# Patient Record
Sex: Male | Born: 1990
Health system: Southern US, Community
[De-identification: ages and names within clinical notes are randomized; demographics above are authoritative.]

## PROBLEM LIST (undated history)

## (undated) DIAGNOSIS — L409 Psoriasis, unspecified: Secondary | ICD-10-CM

---

## 2016-11-25 ENCOUNTER — Emergency Department (HOSPITAL_COMMUNITY)
Admission: EM | Admit: 2016-11-25 | Discharge: 2016-11-25 | Disposition: A | Discharge status: Home / Self Care | Payer: Self-pay | Attending: Emergency Medicine | Admitting: Emergency Medicine

## 2016-11-25 ENCOUNTER — Encounter (HOSPITAL_COMMUNITY): Payer: Self-pay

## 2016-11-25 ENCOUNTER — Emergency Department (HOSPITAL_COMMUNITY): Payer: Self-pay

## 2016-11-25 DIAGNOSIS — R51 Headache: Secondary | ICD-10-CM | POA: Insufficient documentation

## 2016-11-25 DIAGNOSIS — R112 Nausea with vomiting, unspecified: Secondary | ICD-10-CM | POA: Insufficient documentation

## 2016-11-25 DIAGNOSIS — R519 Headache, unspecified: Secondary | ICD-10-CM

## 2016-11-25 HISTORY — DX: Psoriasis, unspecified: L40.9

## 2016-11-25 LAB — COMPREHENSIVE METABOLIC PANEL
ALBUMIN: 4 g/dL (ref 3.5–5.0)
ALK PHOS: 42 U/L (ref 38–126)
ALT: 28 U/L (ref 17–63)
AST: 31 U/L (ref 15–41)
Anion gap: 4 — ABNORMAL LOW (ref 5–15)
BILIRUBIN TOTAL: 1.2 mg/dL (ref 0.3–1.2)
BUN: 15 mg/dL (ref 6–20)
CALCIUM: 9.3 mg/dL (ref 8.9–10.3)
CO2: 31 mmol/L (ref 22–32)
CREATININE: 1.17 mg/dL (ref 0.61–1.24)
Chloride: 106 mmol/L (ref 101–111)
GFR calc non Af Amer: >60 mL/min (ref >60)
GLUCOSE: 78 mg/dL (ref 65–99)
Potassium: 4.3 mmol/L (ref 3.5–5.1)
SODIUM: 141 mmol/L (ref 135–145)
TOTAL PROTEIN: 6.5 g/dL (ref 6.5–8.1)

## 2016-11-25 LAB — LIPASE, BLOOD: Lipase: 28 U/L (ref 11–51)

## 2016-11-25 LAB — CBC WITH DIFFERENTIAL/PLATELET
BASOS PCT: 0 %
Basophils Absolute: 0 10*3/uL (ref 0.0–0.1)
EOS ABS: 0.2 10*3/uL (ref 0.0–0.7)
EOS PCT: 2 %
HCT: 40.1 % (ref 39.0–52.0)
Hemoglobin: 14.1 g/dL (ref 13.0–17.0)
LYMPHS ABS: 3.4 10*3/uL (ref 0.7–4.0)
Lymphocytes Relative: 28 %
MCH: 32.4 pg (ref 26.0–34.0)
MCHC: 35.2 g/dL (ref 30.0–36.0)
MCV: 92.2 fL (ref 78.0–100.0)
MONO ABS: 0.7 10*3/uL (ref 0.1–1.0)
MONOS PCT: 6 %
Neutro Abs: 7.6 10*3/uL (ref 1.7–7.7)
Neutrophils Relative %: 64 %
PLATELETS: 238 10*3/uL (ref 150–400)
RBC: 4.35 MIL/uL (ref 4.22–5.81)
RDW: 11.8 % (ref 11.5–15.5)
WBC: 11.9 10*3/uL — ABNORMAL HIGH (ref 4.0–10.5)

## 2016-11-25 MED ORDER — ONDANSETRON 4 MG PO TBDP: 4.0000 mg | ORAL_TABLET | Freq: Three times a day (TID) | ORAL | 0 refills | Status: DC | PRN

## 2016-11-25 MED ORDER — PANTOPRAZOLE SODIUM 40 MG PO TBEC: 40.0000 mg | DELAYED_RELEASE_TABLET | Freq: Every day | ORAL | 0 refills | Status: DC

## 2016-11-25 MED ORDER — ONDANSETRON 4 MG PO TBDP
4.0000 mg | ORAL_TABLET | Freq: Once | ORAL | Status: AC | PRN
  Administered 2016-11-25: 4 mg via ORAL
  Filled 2016-11-25: qty 1

## 2016-11-25 NOTE — ED Provider Notes (Addendum)
[facility]-EMERGENCY DEPT Provider Note   CSN: [id number] Arrival date & time: 11/25/16  0801     History   Chief Complaint Chief Complaint  Patient presents with  . Headache  . Abdominal Pain    HPI Jeremy Mcneil is a 26 y.o. male.  HPI. 26 year old male presents with a chief complaint of headache since awakening around 6 AM. Pain was diffuse. He raises it as a 7 out of 10. Took Aleve and has felt better now has no headache. Headache last about 2 hours. He does not think the headache will come up a suitable he gets over this time. He has had on and off headaches at least once or twice a week for the past 1-1/2 months. These are getting more frequent. Today was worse than typical. Headaches also seem to change locations. There is never associated nausea, vomiting, blurry vision, or photophobia. He did not have fever today. No focal weakness or numbness. Patient also has been having intermittent nausea and occasional vomiting after eating. He states it starts about 5 minutes after eating almost anything. Typically does not vomit. Denies of there is actually abdominal pain. This does not seem to coincide with the headache but has been lasting just about as long. Currently feels asymptomatic. Was not the worst headache of his life  Past Medical History:  Diagnosis Date  . Psoriasis     There are no active problems to display for this patient.   History reviewed. No pertinent surgical history.     Home Medications    Prior to Admission medications   Medication Sig Start Date End Date Taking? Authorizing Provider  Ascorbic Acid (VITAMIN C PO) Take 2 tablets by mouth daily.   Yes Historical Provider, MD  Ibuprofen (ADVIL MIGRAINE) 200 MG CAPS Take 400 mg by mouth every 6 (six) hours as needed (for pain).   Yes Historical Provider, MD  ondansetron (ZOFRAN ODT) 4 MG disintegrating tablet Take 1 tablet (4 mg total) by mouth every 8 (eight) hours as needed for nausea or vomiting. 11/25/16    Pricilla Loveless, MD  pantoprazole (PROTONIX) 40 MG tablet Take 1 tablet (40 mg total) by mouth daily. 11/25/16   Pricilla Loveless, MD    Family History History reviewed. No pertinent family history.  Social History Social History  Substance Use Topics  . Smoking status: Never Smoker  . Smokeless tobacco: Never Used  . Alcohol use Yes     Comment: social     Allergies   Patient has no known allergies.   Review of Systems Review of Systems  Constitutional: Negative for fever.  Gastrointestinal: Positive for abdominal pain and nausea.  Neurological: Positive for headaches.  All other systems reviewed and are negative.    Physical Exam Updated Vital Signs BP 121/82 (BP Location: Left Arm)   Pulse 61   Temp 97.9 F (36.6 C) (Oral)   Resp 18   SpO2 99%   Physical Exam  Constitutional: He is oriented to person, place, and time. He appears well-developed and well-nourished.  HENT:  Head: Normocephalic and atraumatic.  Right Ear: External ear normal.  Left Ear: External ear normal.  Nose: Nose normal.  Eyes: Right eye exhibits no discharge. Left eye exhibits no discharge.  Neck: Neck supple.  Cardiovascular: Normal rate, regular rhythm and normal heart sounds.   Pulmonary/Chest: Effort normal and breath sounds normal.  Abdominal: Soft. He exhibits no distension. There is no tenderness.  Musculoskeletal: He exhibits no edema.  Neurological: He is  alert and oriented to person, place, and time.  CN 3-12 grossly intact. 5/5 strength in all 4 extremities. Grossly normal sensation. Normal finger to nose.   Skin: Skin is warm and dry.  Nursing note and vitals reviewed.    ED Treatments / Results  Labs (all labs ordered are listed, but only abnormal results are displayed) Labs Reviewed  COMPREHENSIVE METABOLIC PANEL - Abnormal; Notable for the following:       Result Value   Anion gap 4 (*)    All other components within normal limits  CBC WITH DIFFERENTIAL/PLATELET -  Abnormal; Notable for the following:    WBC 11.9 (*)    All other components within normal limits  LIPASE, BLOOD    EKG  EKG Interpretation None       Radiology Ct Head Wo Contrast  Result Date: 11/25/2016 CLINICAL DATA:  Progressive headache for 1.5 months, with nausea and vomiting. No reported injury. EXAM: CT HEAD WITHOUT CONTRAST TECHNIQUE: Contiguous axial images were obtained from the base of the skull through the vertex without intravenous contrast. COMPARISON:  None. FINDINGS: Brain: No evidence of parenchymal hemorrhage or extra-axial fluid collection. No mass lesion, mass effect, or midline shift. No CT evidence of acute infarction. Cerebral volume is age appropriate. No ventriculomegaly. Vascular: No hyperdense vessel or unexpected calcification. Skull: No evidence of calvarial fracture. Sinuses/Orbits: The visualized paranasal sinuses are essentially clear. Other:  The mastoid air cells are unopacified. IMPRESSION: Negative head CT.  No evidence of acute intracranial abnormality. Electronically Signed   By: Delbert PhenixJason A Poff M.D.   On: 11/25/2016 13:23    Procedures Procedures (including critical care time)  Medications Ordered in ED Medications  ondansetron (ZOFRAN-ODT) disintegrating tablet 4 mg (4 mg Oral Given 11/25/16 0820)     Initial Impression / Assessment and Plan / ED Course  I have reviewed the triage vital signs and the nursing notes.  Pertinent labs & imaging results that were available during my care of the patient were reviewed by me and considered in my medical decision making (see chart for details).  Clinical Course as of Nov 26 1931  Fri Nov 25, 2016  1250 Given the progressively worsening headaches, will get CT head. Suspicion for subarachnoid hemorrhage, meningitis, or other acute CNS process is low. Will he did have an acute headache this morning it was only 7/10, my suspicion for ruptured aneurysm is low. Nausea is of unclear etiology but will prescribe  a PPI and nausea medicine to see if this helps. Will rule out other possible causes of continued headache such as anemia.  [SG]    Clinical Course User Index [SG] Pricilla LovelessScott Anahla Bevis, MD    CT head benign. This headache is acute on chronic. I don't think there is an aneurysmal component. Unlikely to be infectious. No mass on CT. Plan to d/c home with f/u with PCP. There is no abd pain but does have post-prandial vomiting. Will give PPI and anti-emetic. Discussed return precautions.  Final Clinical Impressions(s) / ED Diagnoses   Final diagnoses:  Generalized headache  Nausea and vomiting in adult    New Prescriptions Discharge Medication List as of 11/25/2016  2:20 PM    START taking these medications   Details  ondansetron (ZOFRAN ODT) 4 MG disintegrating tablet Take 1 tablet (4 mg total) by mouth every 8 (eight) hours as needed for nausea or vomiting., Starting Fri 11/25/2016, Print    pantoprazole (PROTONIX) 40 MG tablet Take 1 tablet (40 mg total) by  mouth daily., Starting Fri 11/25/2016, Print         Pricilla Loveless, MD 11/25/16 1610    Pricilla Loveless, MD 12/02/16 2214

## 2016-11-25 NOTE — ED Triage Notes (Signed)
Per pt, has had abdominal pain, nausea, headache x 1 1/2.  No fever.  States some problems with hearing in left ear.

## 2018-01-16 ENCOUNTER — Ambulatory Visit
Admission: EM | Admit: 2018-01-16 | Discharge: 2018-01-16 | Disposition: A | Discharge status: Home / Self Care | Payer: BLUE CROSS/BLUE SHIELD | Attending: Family Medicine | Admitting: Family Medicine

## 2018-01-16 ENCOUNTER — Encounter: Payer: Self-pay | Admitting: Emergency Medicine

## 2018-01-16 ENCOUNTER — Other Ambulatory Visit: Payer: Self-pay

## 2018-01-16 DIAGNOSIS — R05 Cough: Secondary | ICD-10-CM | POA: Diagnosis not present

## 2018-01-16 DIAGNOSIS — H9192 Unspecified hearing loss, left ear: Secondary | ICD-10-CM | POA: Diagnosis not present

## 2018-01-16 DIAGNOSIS — Z136 Encounter for screening for cardiovascular disorders: Secondary | ICD-10-CM | POA: Diagnosis not present

## 2018-01-16 DIAGNOSIS — R0789 Other chest pain: Secondary | ICD-10-CM | POA: Diagnosis not present

## 2018-01-16 DIAGNOSIS — R51 Headache: Secondary | ICD-10-CM | POA: Diagnosis not present

## 2018-01-16 DIAGNOSIS — R42 Dizziness and giddiness: Secondary | ICD-10-CM | POA: Insufficient documentation

## 2018-01-16 MED ORDER — ONDANSETRON 4 MG PO TBDP: 4.0000 mg | ORAL_TABLET | Freq: Three times a day (TID) | ORAL | 0 refills | Status: DC | PRN

## 2018-01-16 NOTE — ED Triage Notes (Signed)
Patient c/o cough, headache, and dizziness that started this afternoon.

## 2018-01-16 NOTE — Discharge Instructions (Signed)
Your exam and EKG were unrevealing.  I would recommend seeing your primary and likely ENT.  I am unsure of the cause of your symptoms at this time.  Take care  Dr. Adriana Simasook

## 2018-01-16 NOTE — ED Provider Notes (Signed)
[facility]    CSN: [id number] Arrival date & time: 01/16/18  1926  History   Chief Complaint Chief Complaint  Patient presents with  . Headache  . Dizziness  . Cough   HPI   27 year old male presents with a myriad of complaints.  Patient reports that he has had ear pressure, sharp pains in his head, neck pressure, and chest pressure intermittently for months.  His wife states that he seems to have trouble with his stomach.  She feels like this may be the culprit.  Additionally, he reports associated hearing loss from his left ear.  He states that he has had trouble with his left ear since he was a child.  He reports that he has had nausea and dizziness as of today.  Patient states that he just does not feel well.  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  No reports of chest pain or pressure currently.  No known exacerbating relieving factors.  No other complaints at this time.  Past Medical History:  Diagnosis Date  . Psoriasis    Surgical Hx - No past surgeries.  Home Medications    Prior to Admission medications   Medication Sig Start Date End Date Taking? Authorizing Provider  Ascorbic Acid (VITAMIN C PO) Take 2 tablets by mouth daily.    [provider]  Ibuprofen (ADVIL MIGRAINE) 200 MG CAPS Take 400 mg by mouth every 6 (six) hours as needed (for pain).    [provider]  ondansetron (ZOFRAN ODT) 4 MG disintegrating tablet Take 1 tablet (4 mg total) by mouth every 8 (eight) hours as needed for nausea or vomiting. 01/16/18   Coral Spikes, DO   Family History No reported family hx.  Social History Social History   Tobacco Use  . Smoking status: Never Smoker  . Smokeless tobacco: Never Used  Substance Use Topics  . Alcohol use: Yes    Comment: social  . Drug use: No     Allergies   Patient has no known allergies.   Review of Systems Review of Systems  Constitutional:  Negative.   HENT:       Ear pressure. Hearing loss.  Cardiovascular:       Chest pressure.   Musculoskeletal:       Neck pressure.  Neurological: Positive for headaches.   Physical Exam Triage Vital Signs ED Triage Vitals  Enc Vitals Group     BP 01/16/18 1944 140/90     Pulse Rate 01/16/18 1944 (!) 57     Resp 01/16/18 1944 16     Temp 01/16/18 1944 98.3 F (36.8 C)     Temp Source 01/16/18 1944 Oral     SpO2 01/16/18 1944 100 %     Weight 01/16/18 1942 220 lb (99.8 kg)     Height 01/16/18 1942 6' 1"  (1.854 m)     Head Circumference --      Peak Flow --      Pain Score 01/16/18 1941 2     Pain Loc --      Pain Edu? --      Excl. in [place]? --    Updated Vital Signs BP 140/90 (BP Location: Left Arm)   Pulse (!) 57   Temp 98.3 F (36.8 C) (Oral)   Resp 16   Ht 6' 1"  (1.854 m)   Wt 220 lb (99.8 kg)   SpO2 100%  BMI 29.03 kg/m    Physical Exam  Constitutional: He is oriented to person, place, and time. He appears well-developed. No distress.  HENT:  Head: Normocephalic and atraumatic.  Mouth/Throat: Oropharynx is clear and moist.  Normal TMs bilaterally.  Eyes: Conjunctivae are normal. Right eye exhibits no discharge. Left eye exhibits no discharge.  Neck: Normal range of motion. Neck supple.  Cardiovascular:  Bradycardic.  Regular rhythm.  Pulmonary/Chest: Effort normal and breath sounds normal. He exhibits no tenderness.  Neurological: He is alert and oriented to person, place, and time.  Psychiatric: His behavior is normal.  Flat affect.  Nursing note and vitals reviewed.  UC Treatments / Results  Labs (all labs ordered are listed, but only abnormal results are displayed) Labs Reviewed - No data to display  EKG Interpretation: Sinus bradycardia at the rate of 53.  First-degree AV block.  Incomplete right bundle branch block.  Nonspecific ST wave changes.  Radiology No results found.  Procedures Procedures (including critical care time)  Medications  Ordered in UC Medications - No data to display   Initial Impression / Assessment and Plan / UC Course  I have reviewed the triage vital signs and the nursing notes.  Pertinent labs & imaging results that were available during my care of the patient were reviewed by me and considered in my medical decision making (see chart for details).     27 year old male presents with a multitude of complaints.  His exam is unrevealing.  His EKG is abnormal but does not appear to be ischemic.  Does not warrant urgent evaluation as he is currently asymptomatic from a cardiac perspective.  I informed him that I do not know the cause of his constellation of symptoms.  I have advised that he follow-up with his primary care physician so that he can see specialist for further evaluation.  Zofran for nausea.  Supportive care.  Final Clinical Impressions(s) / UC Diagnoses   Final diagnoses:  Hearing loss of left ear, unspecified hearing loss type  Chest pressure    ED Discharge Orders        Ordered    ondansetron (ZOFRAN ODT) 4 MG disintegrating tablet  Every 8 hours PRN     01/16/18 2017     Controlled Substance Prescriptions [place] Controlled Substance Registry consulted? Not Applicable   Coral Spikes, DO 01/16/18 2056

## 2018-01-18 ENCOUNTER — Emergency Department: Payer: BLUE CROSS/BLUE SHIELD

## 2018-01-18 ENCOUNTER — Other Ambulatory Visit: Payer: Self-pay

## 2018-01-18 ENCOUNTER — Emergency Department
Admission: EM | Admit: 2018-01-18 | Discharge: 2018-01-18 | Disposition: A | Discharge status: Home / Self Care | Payer: BLUE CROSS/BLUE SHIELD | Attending: Emergency Medicine | Admitting: Emergency Medicine

## 2018-01-18 DIAGNOSIS — R42 Dizziness and giddiness: Secondary | ICD-10-CM | POA: Diagnosis not present

## 2018-01-18 DIAGNOSIS — R5383 Other fatigue: Secondary | ICD-10-CM | POA: Diagnosis not present

## 2018-01-18 LAB — BASIC METABOLIC PANEL
Anion gap: 9 (ref 5–15)
BUN: 16 mg/dL (ref 6–20)
CHLORIDE: 103 mmol/L (ref 101–111)
CO2: 28 mmol/L (ref 22–32)
Calcium: 9.6 mg/dL (ref 8.9–10.3)
Creatinine, Ser: 1.31 mg/dL — ABNORMAL HIGH (ref 0.61–1.24)
GFR calc non Af Amer: >60 mL/min (ref >60)
Glucose, Bld: 158 mg/dL — ABNORMAL HIGH (ref 65–99)
POTASSIUM: 3.6 mmol/L (ref 3.5–5.1)
SODIUM: 140 mmol/L (ref 135–145)

## 2018-01-18 LAB — URINALYSIS, COMPLETE (UACMP) WITH MICROSCOPIC
BACTERIA UA: NONE SEEN
BILIRUBIN URINE: NEGATIVE
Glucose, UA: NEGATIVE mg/dL
Hgb urine dipstick: NEGATIVE
KETONES UR: NEGATIVE mg/dL
LEUKOCYTES UA: NEGATIVE
Nitrite: NEGATIVE
PH: 7 (ref 5.0–8.0)
PROTEIN: NEGATIVE mg/dL
RBC / HPF: NONE SEEN RBC/hpf (ref 0–5)
Specific Gravity, Urine: 1.015 (ref 1.005–1.030)

## 2018-01-18 LAB — URINE DRUG SCREEN, QUALITATIVE (ARMC ONLY)
Amphetamines, Ur Screen: NOT DETECTED
BARBITURATES, UR SCREEN: NOT DETECTED
BENZODIAZEPINE, UR SCRN: NOT DETECTED
Cannabinoid 50 Ng, Ur ~~LOC~~: NOT DETECTED
Cocaine Metabolite,Ur ~~LOC~~: NOT DETECTED
MDMA (Ecstasy)Ur Screen: NOT DETECTED
METHADONE SCREEN, URINE: NOT DETECTED
Opiate, Ur Screen: NOT DETECTED
Phencyclidine (PCP) Ur S: NOT DETECTED
Tricyclic, Ur Screen: NOT DETECTED

## 2018-01-18 LAB — CBC
HEMATOCRIT: 45.7 % (ref 40.0–52.0)
HEMOGLOBIN: 15.8 g/dL (ref 13.0–18.0)
MCH: 32.9 pg (ref 26.0–34.0)
MCHC: 34.5 g/dL (ref 32.0–36.0)
MCV: 95.3 fL (ref 80.0–100.0)
Platelets: 273 10*3/uL (ref 150–440)
RBC: 4.8 MIL/uL (ref 4.40–5.90)
RDW: 12.8 % (ref 11.5–14.5)
WBC: 11.3 10*3/uL — AB (ref 3.8–10.6)

## 2018-01-18 MED ORDER — MECLIZINE HCL 25 MG PO TABS: 25.0000 mg | ORAL_TABLET | Freq: Three times a day (TID) | ORAL | 0 refills | Status: DC | PRN

## 2018-01-18 MED ORDER — MECLIZINE HCL 25 MG PO TABS
25.0000 mg | ORAL_TABLET | Freq: Once | ORAL | Status: AC
  Administered 2018-01-18: 25 mg via ORAL
  Filled 2018-01-18: qty 1

## 2018-01-18 NOTE — ED Notes (Signed)
Patient is able to walk with a steady gait at this time and reports that he no longer feels dizzy.

## 2018-01-18 NOTE — ED Notes (Signed)
Dr. Veronese in room to assess patient at this time.  Will continue to monitor.   

## 2018-01-18 NOTE — ED Triage Notes (Signed)
Pt c/o intermittent dizziness with nausea since Monday with nausea and chest pressure. States he was not able to drive due to the dizziness. [facility] for same dx. States they did an ecg there.

## 2018-01-18 NOTE — ED Notes (Signed)
First Nurse Note:  Complaining of dizziness and nausea.  Placed in WC.  Alert and oriented, wife at side.

## 2018-01-18 NOTE — ED Notes (Signed)
Patient transported to CT 

## 2018-01-18 NOTE — ED Provider Notes (Signed)
[facility] Emergency Department Provider Note  ____________________________________________  Time seen: Approximately 10:02 AM  I have reviewed the triage vital signs and the nursing notes.   HISTORY  Chief Complaint Dizziness   HPI Jeremy Mcneil is a 27 y.o. male presents for evaluation of dizziness. The patient reports that 3 days ago he had an episode of sudden onset vertigo that was severe associated with nausea while at work. He does office work. Patient reports that the episode was so severe it brought him down to his knees. He denies feeling lightheaded or faint. No chest pain or palpitations, no headaches, no changes in vision, no dysphagia, no dysarthria. Patient reports a second episode yesterday at home. Both lasted 25-30 min and resolved with no intervention. Today denies having a similar episode however reports that he has been off balance the whole day today. He denies any vertigo at rest. He denies facial droop, slurred speech, unilateral weakness or numbness. Patient does report chronic left hearing loss. He reports that as a child he had several ear infections and has impaired hearing on the left ear. He reports that for the last year he has had several weekly episodes where he feels a pressure like sensation starting on his neck that moves to his chest and causes bilateral decrease hearing which lasts a few minutes and resolves with no intervention. These episodes are not associated with vertigo. He has no pain at this time. patient denies history of smoking, alcohol use, or drug use.  Past Medical History:  Diagnosis Date  . Psoriasis     Prior to Admission medications   Medication Sig Start Date End Date Taking? Authorizing Provider  Ascorbic Acid (VITAMIN C PO) Take 2 tablets by mouth daily.    [provider]  Ibuprofen (ADVIL MIGRAINE) 200 MG CAPS Take 400 mg by mouth every 6 (six) hours as needed (for pain).    [provider]  meclizine (ANTIVERT) 25 MG tablet Take 1 tablet (25 mg total) by mouth 3 (three) times daily as needed for dizziness. 01/18/18   Nita SickleVeronese, Horace, MD  ondansetron (ZOFRAN ODT) 4 MG disintegrating tablet Take 1 tablet (4 mg total) by mouth every 8 (eight) hours as needed for nausea or vomiting. 01/16/18   Tommie Samsook, Jayce G, DO    Allergies Patient has no known allergies.  FH No fh of stroke  Social History Social History   Tobacco Use  . Smoking status: Never Smoker  . Smokeless tobacco: Never Used  Substance Use Topics  . Alcohol use: Yes    Comment: social  . Drug use: No    Review of Systems  Constitutional: Negative for fever. Eyes: Negative for visual changes. ENT: Negative for sore throat. Neck: No neck pain  Cardiovascular: Negative for chest pain. Respiratory: Negative for shortness of breath. Gastrointestinal: Negative for abdominal pain, vomiting or diarrhea. + nausea Genitourinary: Negative for dysuria. Musculoskeletal: Negative for back pain. Skin: Negative for rash. Neurological: Negative for headaches, weakness or numbness. + vertigo Psych: No SI or HI  ____________________________________________   PHYSICAL EXAM:  VITAL SIGNS: ED Triage Vitals  Enc Vitals Group     BP 01/18/18 0932 133/75     Pulse Rate 01/18/18 0932 61     Resp 01/18/18 0932 16     Temp 01/18/18 0932 98.2 F (36.8 C)     Temp Source 01/18/18 0932 Oral     SpO2 01/18/18 0932 100 %     Weight 01/18/18  0933 220 lb (99.8 kg)     Height 01/18/18 0933 6\' 1"  (1.854 m)     Head Circumference --      Peak Flow --      Pain Score 01/18/18 0933 0     Pain Loc --      Pain Edu? --      Excl. in [place]? --     Constitutional: Alert and oriented. Well appearing and in no apparent distress. HEENT:      Head: Normocephalic and atraumatic.         Eyes: Conjunctivae are normal. Sclera is non-icteric.        Ears: TMs visualized and clear      Mouth/Throat: Mucous membranes are  moist.       Neck: Supple with no signs of meningismus. No bruits Cardiovascular: Regular rate and rhythm. No murmurs, gallops, or rubs. 2+ symmetrical distal pulses are present in all extremities. No JVD. Respiratory: Normal respiratory effort. Lungs are clear to auscultation bilaterally. No wheezes, crackles, or rhonchi.  Gastrointestinal: Soft, non tender, and non distended with positive bowel sounds. No rebound or guarding. Musculoskeletal: Nontender with normal range of motion in all extremities. No edema, cyanosis, or erythema of extremities. Neurologic: Normal speech and language. A & O x3, PERRL, EOMI, no nystagmus, CN II-XII intact, symmetric elevation of b/l soft palate, motor testing reveals good tone and bulk throughout. There is no evidence of pronator drift or dysmetria. Muscle strength is 5/5 throughout.  Sensory examination is intact. No truncal ataxia. Patient is slight off balance when ambulating, does not favor any side Skin: Skin is warm, dry and intact. No rash noted. Psychiatric: Mood and affect are normal. Speech and behavior are normal.  ____________________________________________   LABS (all labs ordered are listed, but only abnormal results are displayed)  Labs Reviewed  BASIC METABOLIC PANEL - Abnormal; Notable for the following components:      Result Value   Glucose, Bld 158 (*)    Creatinine, Ser 1.31 (*)    All other components within normal limits  CBC - Abnormal; Notable for the following components:   WBC 11.3 (*)    All other components within normal limits  URINALYSIS, COMPLETE (UACMP) WITH MICROSCOPIC - Abnormal; Notable for the following components:   Color, Urine YELLOW (*)    APPearance CLEAR (*)    Squamous Epithelial / LPF 0-5 (*)    All other components within normal limits  URINE DRUG SCREEN, QUALITATIVE ([facility] ONLY)  CBG MONITORING, ED   ____________________________________________  EKG  ED ECG REPORT I, Nita Sickle, the  attending physician, personally viewed and interpreted this ECG.  Normal sinus rhythm, rate of 67, incomplete right bundle branch block, normal QTC, left axis deviation, diffuse T-wave flattening, no ST elevations or depressions. Unchanged from prior from 2 days ago. ____________________________________________  RADIOLOGY  I have personally reviewed the images performed during this visit and I agree with the Radiologist's read.   Interpretation by Radiologist:  Ct Head Wo Contrast  Result Date: 01/18/2018 CLINICAL DATA:  Intermittent dizziness, nausea EXAM: CT HEAD WITHOUT CONTRAST TECHNIQUE: Contiguous axial images were obtained from the base of the skull through the vertex without intravenous contrast. COMPARISON:  11/25/2016 FINDINGS: Brain: No acute intracranial abnormality. Specifically, no hemorrhage, hydrocephalus, mass lesion, acute infarction, or significant intracranial injury. Vascular: No hyperdense vessel or unexpected calcification. Skull: No acute calvarial abnormality. Sinuses/Orbits: Visualized paranasal sinuses and mastoids clear. Orbital soft tissues unremarkable. Other: None IMPRESSION: Normal study.  Electronically Signed   By: Charlett Nose M.D.   On: 01/18/2018 10:25   ____________________________________________   PROCEDURES  Procedure(s) performed: None Procedures Critical Care performed:  None ____________________________________________   INITIAL IMPRESSION / ASSESSMENT AND PLAN / ED COURSE   27 y.o. male presents for evaluation of dizziness/ vertigo. neurological exam shows no evidence of pronator drift, dysmetria, dysarthria, dysphagia. Patient does look slightly imbalance with ambulation but able to walk. He has no bruits, no nystagmus. No personal or family history of stroke. Patient does have a history of chronic ear infections and hearing issues which could be the cause of his vertigo at this time. His TMs are visualized bilaterally and normal. Head CT  negative for acute process. Labs showing hyperglycemia although not fasting. Creatinine is 1.31 but normal GFR. UA and UDS WNL. Will give meclizine and reassess. If patient's symptoms resolve and he remains neuro intact will refer to ENT. Otherwise will pursue MRI.  Clinical Course as of Jan 19 1136  Thu Jan 18, 2018  1134 the patient's symptoms have resolved with meclizine. He is walking with no difficulty. No longer feeling vertiginous. He remains completely neurologically intact. At this time patient be discharged with follow-up with ENT with a prescription for meclizine. I discussed return precautions for any signs of stroke.  [CV]    Clinical Course User Index [CV] Don Perking [place], MD     As part of my medical decision making, I reviewed the following data within the electronic MEDICAL RECORD NUMBER Nursing notes reviewed and incorporated, Labs reviewed , EKG interpreted , Old EKG reviewed, Radiograph reviewed , Notes from prior ED visits and [place] Controlled Substance Database    Pertinent labs & imaging results that were available during my care of the patient were reviewed by me and considered in my medical decision making (see chart for details).    ____________________________________________   FINAL CLINICAL IMPRESSION(S) / ED DIAGNOSES  Final diagnoses:  Vertigo      NEW MEDICATIONS STARTED DURING THIS VISIT:  ED Discharge Orders        Ordered    meclizine (ANTIVERT) 25 MG tablet  3 times daily PRN     01/18/18 1135       Note:  This document was prepared using Dragon voice recognition software and may include unintentional dictation errors.    Don Perking, [place], MD 01/18/18 [phone]

## 2018-01-22 ENCOUNTER — Encounter: Payer: Self-pay | Admitting: Family Medicine

## 2018-01-22 ENCOUNTER — Ambulatory Visit: Payer: BLUE CROSS/BLUE SHIELD | Admitting: Family Medicine

## 2018-01-22 VITALS — BP 130/92 | HR 80 | Temp 98.1°F | Ht 73.0 in | Wt 238.0 lb

## 2018-01-22 DIAGNOSIS — J0100 Acute maxillary sinusitis, unspecified: Secondary | ICD-10-CM

## 2018-01-22 DIAGNOSIS — Z7689 Persons encountering health services in other specified circumstances: Secondary | ICD-10-CM | POA: Diagnosis not present

## 2018-01-22 MED ORDER — AMOXICILLIN 500 MG PO CAPS: 500.0000 mg | ORAL_CAPSULE | Freq: Three times a day (TID) | ORAL | 0 refills | Status: DC

## 2018-01-22 NOTE — Progress Notes (Signed)
Name: Jeremy Mcneil   MRN: [id number]    DOB: October 04, 1991   Date:01/22/2018       Progress Note  Subjective  Chief Complaint  Chief Complaint  Patient presents with  . Establish Care  . Sinusitis    sore throat, cong, cough- yellow production, neck and back hurting    Patient presents for establishment of care with new physician.    Sinusitis  This is a new problem. The current episode started in the past 7 days. The problem has been gradually worsening since onset. The maximum temperature recorded prior to his arrival was 100.4 - 100.9 F. The fever has been present for 1 to 2 days. His pain is at a severity of 3/10 (maxillary ). The pain is mild. Associated symptoms include chills, congestion, coughing, diaphoresis, headaches, neck pain, sinus pressure, sneezing, a sore throat and swollen glands. Pertinent negatives include no ear pain, hoarse voice or shortness of breath. Past treatments include acetaminophen. The treatment provided moderate relief.    No problem-specific Assessment & Plan notes found for this encounter.   Past Medical History:  Diagnosis Date  . Psoriasis   . Psoriasis     History reviewed. No pertinent surgical history.  Family History  Problem Relation Age of Onset  . Diabetes Mother   . Diabetes Father   . Hypertension Father   . Diabetes Paternal Grandmother   . Heart disease Paternal Grandmother   . Hypertension Paternal Grandmother     Social History   Socioeconomic History  . Marital status: Married    Spouse name: Not on file  . Number of children: Not on file  . Years of education: Not on file  . Highest education level: Not on file  Social Needs  . Financial resource strain: Not on file  . Food insecurity - worry: Not on file  . Food insecurity - inability: Not on file  . Transportation needs - medical: Not on file  . Transportation needs - non-medical: Not on file  Occupational History  . Not on file  Tobacco Use  . Smoking status:  Never Smoker  . Smokeless tobacco: Never Used  Substance and Sexual Activity  . Alcohol use: Yes    Comment: social  . Drug use: No  . Sexual activity: Yes  Other Topics Concern  . Not on file  Social History Narrative  . Not on file    No Known Allergies  Outpatient Medications Prior to Visit  Medication Sig Dispense Refill  . Ascorbic Acid (VITAMIN C PO) Take 2 tablets by mouth daily.    . Ibuprofen (ADVIL MIGRAINE) 200 MG CAPS Take 400 mg by mouth every 6 (six) hours as needed (for pain).    . meclizine (ANTIVERT) 25 MG tablet Take 1 tablet (25 mg total) by mouth 3 (three) times daily as needed for dizziness. 30 tablet 0  . ondansetron (ZOFRAN ODT) 4 MG disintegrating tablet Take 1 tablet (4 mg total) by mouth every 8 (eight) hours as needed for nausea or vomiting. 10 tablet 0   No facility-administered medications prior to visit.     Review of Systems  Constitutional: Positive for chills and diaphoresis. Negative for fever, malaise/fatigue and weight loss.  HENT: Positive for congestion, sinus pressure, sneezing and sore throat. Negative for ear discharge, ear pain and hoarse voice.   Eyes: Negative for blurred vision.  Respiratory: Positive for cough. Negative for sputum production, shortness of breath and wheezing.   Cardiovascular: Negative for chest pain,  palpitations and leg swelling.  Gastrointestinal: Negative for abdominal pain, blood in stool, constipation, diarrhea, heartburn, melena and nausea.  Genitourinary: Negative for dysuria, frequency, hematuria and urgency.  Musculoskeletal: Positive for neck pain. Negative for back pain, joint pain and myalgias.  Skin: Negative for rash.  Neurological: Positive for headaches. Negative for dizziness, tingling, sensory change and focal weakness.  Endo/Heme/Allergies: Negative for environmental allergies and polydipsia. Does not bruise/bleed easily.  Psychiatric/Behavioral: Negative for depression and suicidal ideas. The  patient is not nervous/anxious and does not have insomnia.      Objective  Vitals:   01/22/18 1601  BP: (!) 130/92  Pulse: 80  Temp: 98.1 F (36.7 C)  TempSrc: Oral  Weight: 238 lb (108 kg)  Height: 6\' 1"  (1.854 m)    Physical Exam  Constitutional: He is oriented to person, place, and time and well-developed, well-nourished, and in no distress.  HENT:  Head: Normocephalic.  Right Ear: External ear normal.  Left Ear: External ear normal.  Nose: Nose normal.  Mouth/Throat: Oropharynx is clear and moist.  Eyes: Conjunctivae and EOM are normal. Pupils are equal, round, and reactive to light. Right eye exhibits no discharge. Left eye exhibits no discharge. No scleral icterus.  Neck: Normal range of motion. Neck supple. No JVD present. No tracheal deviation present. No thyromegaly present.  Cardiovascular: Normal rate, regular rhythm, normal heart sounds and intact distal pulses. Exam reveals no gallop and no friction rub.  No murmur heard. Pulmonary/Chest: Breath sounds normal. No respiratory distress. He has no wheezes. He has no rales.  Abdominal: Soft. Bowel sounds are normal. He exhibits no mass. There is no hepatosplenomegaly. There is no tenderness. There is no rebound, no guarding and no CVA tenderness.  Musculoskeletal: Normal range of motion. He exhibits no edema or tenderness.  Lymphadenopathy:    He has no cervical adenopathy.  Neurological: He is alert and oriented to person, place, and time. He has normal sensation, normal strength, normal reflexes and intact cranial nerves. No cranial nerve deficit.  Skin: Skin is warm. No rash noted.  Psychiatric: Mood and affect normal.  Nursing note and vitals reviewed.     Assessment & Plan  Problem List Items Addressed This Visit    None    Visit Diagnoses    Establishing care with new doctor, encounter for    -  Primary   Acute maxillary sinusitis, recurrence not specified       Relevant Medications   amoxicillin  (AMOXIL) 500 MG capsule      Meds ordered this encounter  Medications  . amoxicillin (AMOXIL) 500 MG capsule    Sig: Take 1 capsule (500 mg total) by mouth 3 (three) times daily.    Dispense:  30 capsule    Refill:  0      Dr. Elizabeth Sauer [facility]  01/22/18

## 2018-01-24 ENCOUNTER — Ambulatory Visit: Payer: BLUE CROSS/BLUE SHIELD | Admitting: Family Medicine

## 2018-01-24 ENCOUNTER — Other Ambulatory Visit: Payer: Self-pay

## 2018-01-24 ENCOUNTER — Emergency Department
Admission: EM | Admit: 2018-01-24 | Discharge: 2018-01-24 | Disposition: A | Discharge status: Home / Self Care | Payer: BLUE CROSS/BLUE SHIELD | Attending: Student in an Organized Health Care Education/Training Program | Admitting: Student in an Organized Health Care Education/Training Program

## 2018-01-24 ENCOUNTER — Encounter: Payer: Self-pay | Admitting: Emergency Medicine

## 2018-01-24 DIAGNOSIS — Z79899 Other long term (current) drug therapy: Secondary | ICD-10-CM | POA: Diagnosis not present

## 2018-01-24 DIAGNOSIS — R42 Dizziness and giddiness: Secondary | ICD-10-CM | POA: Insufficient documentation

## 2018-01-24 LAB — CBC
HEMATOCRIT: 47.1 % (ref 40.0–52.0)
Hemoglobin: 15.9 g/dL (ref 13.0–18.0)
MCH: 32.1 pg (ref 26.0–34.0)
MCHC: 33.7 g/dL (ref 32.0–36.0)
MCV: 95.2 fL (ref 80.0–100.0)
PLATELETS: 275 10*3/uL (ref 150–440)
RBC: 4.95 MIL/uL (ref 4.40–5.90)
RDW: 12.7 % (ref 11.5–14.5)
WBC: 12.7 10*3/uL — AB (ref 3.8–10.6)

## 2018-01-24 LAB — BASIC METABOLIC PANEL
Anion gap: 11 (ref 5–15)
BUN: 16 mg/dL (ref 6–20)
CO2: 28 mmol/L (ref 22–32)
CREATININE: 1.23 mg/dL (ref 0.61–1.24)
Calcium: 9.7 mg/dL (ref 8.9–10.3)
Chloride: 103 mmol/L (ref 101–111)
GFR calc Af Amer: >60 mL/min (ref >60)
Glucose, Bld: 94 mg/dL (ref 65–99)
POTASSIUM: 4.8 mmol/L (ref 3.5–5.1)
SODIUM: 142 mmol/L (ref 135–145)

## 2018-01-24 LAB — URINALYSIS, COMPLETE (UACMP) WITH MICROSCOPIC
Bacteria, UA: NONE SEEN
Bilirubin Urine: NEGATIVE
GLUCOSE, UA: NEGATIVE mg/dL
HGB URINE DIPSTICK: NEGATIVE
Ketones, ur: NEGATIVE mg/dL
LEUKOCYTES UA: NEGATIVE
NITRITE: NEGATIVE
PH: 5 (ref 5.0–8.0)
Protein, ur: NEGATIVE mg/dL
RBC / HPF: NONE SEEN RBC/hpf (ref 0–5)
SPECIFIC GRAVITY, URINE: 1.016 (ref 1.005–1.030)

## 2018-01-24 MED ORDER — DIAZEPAM 5 MG PO TABS: 5.0000 mg | ORAL_TABLET | Freq: Two times a day (BID) | ORAL | 0 refills | Status: DC | PRN

## 2018-01-24 MED ORDER — SODIUM CHLORIDE 0.9 % IV BOLUS (SEPSIS)
1000.0000 mL | Freq: Once | INTRAVENOUS | Status: AC
  Administered 2018-01-24: 1000 mL via INTRAVENOUS

## 2018-01-24 NOTE — ED Triage Notes (Addendum)
Presents with dizziness for 2 weeks   Has been for same  Denies any n/v or fever  Also having some tingling to arms and shoulders this am  Also having a "hard time focusing"

## 2018-01-24 NOTE — ED Notes (Signed)
E-sig pad unavailable.  Patient verbalized understanding of discharge instructions and prescriptions.

## 2018-01-24 NOTE — ED Provider Notes (Signed)
[facility] Emergency Department Provider Note    First MD Initiated Contact with Patient 01/24/18 1423     (approximate)  I have reviewed the triage vital signs and the nursing notes.   HISTORY  Chief Complaint Dizziness    HPI Jeremy Mcneil is a 27 y.o. male with recent visit to the ER and primary care doctor for vertiginous symptoms as well as recent initiation of antibiotics for sinusitis presents to the ER because he was at work today and was having trouble focusing.  Feels he was feeling very fatigued and has been having persistent episodes of this sudden onset violent dizziness that initially brought him to his knees when he first came to the ER last week.  States his symptoms are not quite as mild but still occurring several times throughout the day frequently when he is watching TV and symptoms are worsened when he looks from left to the right quickly moves his head in some direction.  Denies any numbness or tingling at this time.  No headache.  Denies any nausea or vomiting right now.  Was able to walk in with no distress.  Came to the ER because he was told to leave work due to concern for him making a mistake at work as he works as a Conservation officer, [employer] at [employer].  Past Medical History:  Diagnosis Date  . Psoriasis   . Psoriasis    Family History  Problem Relation Age of Onset  . Diabetes Mother   . Diabetes Father   . Hypertension Father   . Diabetes Paternal Grandmother   . Heart disease Paternal Grandmother   . Hypertension Paternal Grandmother    History reviewed. No pertinent surgical history. There are no active problems to display for this patient.     Prior to Admission medications   Medication Sig Start Date End Date Taking? Authorizing Provider  amoxicillin (AMOXIL) 500 MG capsule Take 1 capsule (500 mg total) by mouth 3 (three) times daily. 01/22/18   Duanne Limerick, MD  Ascorbic Acid (VITAMIN C PO) Take 2 tablets by mouth daily.     [provider]  diazepam (VALIUM) 5 MG tablet Take 1 tablet (5 mg total) by mouth every 12 (twelve) hours as needed (dizziness). 01/24/18 01/24/19  Willy Eddy, MD  Ibuprofen (ADVIL MIGRAINE) 200 MG CAPS Take 400 mg by mouth every 6 (six) hours as needed (for pain).    [provider]  meclizine (ANTIVERT) 25 MG tablet Take 1 tablet (25 mg total) by mouth 3 (three) times daily as needed for dizziness. 01/18/18   Nita Sickle, MD    Allergies Patient has no known allergies.    Social History Social History   Tobacco Use  . Smoking status: Never Smoker  . Smokeless tobacco: Never Used  Substance Use Topics  . Alcohol use: Yes    Comment: social  . Drug use: No    Review of Systems Patient denies headaches, rhinorrhea, blurry vision, numbness, shortness of breath, chest pain, edema, cough, abdominal pain, nausea, vomiting, diarrhea, dysuria, fevers, rashes or hallucinations unless otherwise stated above in HPI. ____________________________________________   PHYSICAL EXAM:  VITAL SIGNS: Vitals:   01/24/18 1420 01/24/18 1540  BP: 123/82 126/83  Pulse: 67 68  Resp: 17 17  Temp:    SpO2: 100% 100%    Constitutional: Alert and oriented. Well appearing and in no acute distress. Eyes: Conjunctivae are normal.  Head: Atraumatic. Nose: No congestion/rhinnorhea. Mouth/Throat: Mucous membranes  are moist.   Neck: No stridor. Painless ROM.  Cardiovascular: Normal rate, regular rhythm. Grossly normal heart sounds.  Good peripheral circulation. Respiratory: Normal respiratory effort.  No retractions. Lungs CTAB. Gastrointestinal: Soft and nontender. No distention. No abdominal bruits. No CVA tenderness. Genitourinary:  Musculoskeletal: No lower extremity tenderness nor edema.  No joint effusions. Neurologic: CN- intact.  Inducible nystagmus with rightward gaze no facial droop, Normal FNF.  Normal heel to shin.  Sensation intact bilaterally. Normal speech  and language. No gross focal neurologic deficits are appreciated. No gait instability.  Skin:  Skin is warm, dry and intact. No rash noted. Psychiatric: Mood and affect are normal. Speech and behavior are normal.  ____________________________________________   LABS (all labs ordered are listed, but only abnormal results are displayed)  Results for orders placed or performed during the hospital encounter of 01/24/18 (from the past 24 hour(s))  Basic metabolic panel     Status: None   Collection Time: 01/24/18 12:25 PM  Result Value Ref Range   Sodium 142 135 - 145 mmol/L   Potassium 4.8 3.5 - 5.1 mmol/L   Chloride 103 101 - 111 mmol/L   CO2 28 22 - 32 mmol/L   Glucose, Bld 94 65 - 99 mg/dL   BUN 16 6 - 20 mg/dL   Creatinine, Ser 2.13 0.61 - 1.24 mg/dL   Calcium 9.7 8.9 - 08.6 mg/dL   GFR calc non Af Amer >60 >60 mL/min   GFR calc Af Amer >60 >60 mL/min   Anion gap 11 5 - 15  CBC     Status: Abnormal   Collection Time: 01/24/18 12:25 PM  Result Value Ref Range   WBC 12.7 (H) 3.8 - 10.6 K/uL   RBC 4.95 4.40 - 5.90 MIL/uL   Hemoglobin 15.9 13.0 - 18.0 g/dL   HCT 57.8 46.9 - 62.9 %   MCV 95.2 80.0 - 100.0 fL   MCH 32.1 26.0 - 34.0 pg   MCHC 33.7 32.0 - 36.0 g/dL   RDW 52.8 41.3 - 24.4 %   Platelets 275 150 - 440 K/uL  Urinalysis, Complete w Microscopic     Status: Abnormal   Collection Time: 01/24/18 12:25 PM  Result Value Ref Range   Color, Urine YELLOW (A) YELLOW   APPearance CLEAR (A) CLEAR   Specific Gravity, Urine 1.016 1.005 - 1.030   pH 5.0 5.0 - 8.0   Glucose, UA NEGATIVE NEGATIVE mg/dL   Hgb urine dipstick NEGATIVE NEGATIVE   Bilirubin Urine NEGATIVE NEGATIVE   Ketones, ur NEGATIVE NEGATIVE mg/dL   Protein, ur NEGATIVE NEGATIVE mg/dL   Nitrite NEGATIVE NEGATIVE   Leukocytes, UA NEGATIVE NEGATIVE   RBC / HPF NONE SEEN 0 - 5 RBC/hpf   WBC, UA 0-5 0 - 5 WBC/hpf   Bacteria, UA NONE SEEN NONE SEEN   Squamous Epithelial / LPF 0-5 (A) NONE SEEN   Mucus PRESENT     ____________________________________________  EKG My review and personal interpretation at Time: 12:23   Indication: dizziness  Rate: 70  Rhythm: sinus Axis: normal Other: normal intervals, poor r wave progression ____________________________________________  RADIOLOGY   ____________________________________________   PROCEDURES  Procedure(s) performed:  Procedures    Critical Care performed: no ____________________________________________   INITIAL IMPRESSION / ASSESSMENT AND PLAN / ED COURSE  Pertinent labs & imaging results that were available during my care of the patient were reviewed by me and considered in my medical decision making (see chart for details).  DDX: vertigo, dehydration,  labyrinthitis, menierres, dysrhythmia  Jeremy Mcneil is a 27 y.o. who presents to the ED with symptoms as described above.  Patient well-appearing.  Do feel that some of his difficulty focusing is likely a side effect of the meclizine that he is currently taking.  Exam still seems clinically consistent with vertigo and as he is not having any numbness or tingling with a reassuring neuro exam I do not feel that emergent CT or further neuro diagnostic testing clinically indicated at this time.  Does endorse decreased oral intake therefore will give IV fluids.  Patient received IV fluids with some improvement in symptoms.  He is able to ambulate with a steady gait.  At this point I do believe he is appropriate for discharge with follow-up with ear nose and throat and his primary doctor.  Discussed signs and symptoms for which he should return immediately to the hospital.      As part of my medical decision making, I reviewed the following data within the electronic MEDICAL RECORD NUMBER Nursing notes reviewed and incorporated, Labs reviewed, notes from prior ED visits.   ____________________________________________   FINAL CLINICAL IMPRESSION(S) / ED DIAGNOSES  Final diagnoses:  Vertigo   Dizziness      NEW MEDICATIONS STARTED DURING THIS VISIT:  New Prescriptions   DIAZEPAM (VALIUM) 5 MG TABLET    Take 1 tablet (5 mg total) by mouth every 12 (twelve) hours as needed (dizziness).     Note:  This document was prepared using Dragon voice recognition software and may include unintentional dictation errors.    Willy Eddyobinson, Bertice Risse, MD 01/24/18 1550

## 2018-01-25 ENCOUNTER — Other Ambulatory Visit: Payer: Self-pay | Admitting: Otolaryngology

## 2018-01-25 DIAGNOSIS — R42 Dizziness and giddiness: Secondary | ICD-10-CM

## 2018-01-25 DIAGNOSIS — H90A22 Sensorineural hearing loss, unilateral, left ear, with restricted hearing on the contralateral side: Secondary | ICD-10-CM | POA: Diagnosis not present

## 2018-02-01 ENCOUNTER — Ambulatory Visit
Admission: RE | Admit: 2018-02-01 | Discharge: 2018-02-01 | Disposition: A | Discharge status: Home / Self Care | Payer: BLUE CROSS/BLUE SHIELD | Source: Ambulatory Visit | Attending: Otolaryngology | Admitting: Otolaryngology

## 2018-02-01 DIAGNOSIS — R42 Dizziness and giddiness: Secondary | ICD-10-CM | POA: Insufficient documentation

## 2018-02-01 MED ORDER — GADOBENATE DIMEGLUMINE 529 MG/ML IV SOLN
20.0000 mL | Freq: Once | INTRAVENOUS | Status: AC | PRN
  Administered 2018-02-01: 20 mL via INTRAVENOUS

## 2018-02-09 DIAGNOSIS — R42 Dizziness and giddiness: Secondary | ICD-10-CM | POA: Diagnosis not present

## 2018-02-12 DIAGNOSIS — H8102 Meniere's disease, left ear: Secondary | ICD-10-CM | POA: Diagnosis not present

## 2018-02-21 DIAGNOSIS — H8102 Meniere's disease, left ear: Secondary | ICD-10-CM | POA: Diagnosis not present

## 2018-02-28 DIAGNOSIS — H8102 Meniere's disease, left ear: Secondary | ICD-10-CM | POA: Diagnosis not present

## 2018-03-12 DIAGNOSIS — H8102 Meniere's disease, left ear: Secondary | ICD-10-CM | POA: Diagnosis not present

## 2018-03-14 DIAGNOSIS — H8102 Meniere's disease, left ear: Secondary | ICD-10-CM | POA: Diagnosis not present

## 2018-05-22 IMAGING — MR MR BRAIN/IAC WO/W
12 series · 35 of 48 positions shown · IV contrast (multihance)
Comparison: Head CT without contrast 01/18/2018 and 11/25/2016

CLINICAL DATA: 26-year-old male with dizziness, head pressure,
bilateral hearing loss and left ear ringing for 3 weeks. No known
injury.

EXAM:
MRI HEAD WITHOUT AND WITH CONTRAST
TECHNIQUE: Multiplanar, multiecho pulse sequences of the brain and surrounding
structures were obtained without and with intravenous contrast.
CONTRAST:  20mL MULTIHANCE GADOBENATE DIMEGLUMINE 529 MG/ML IV SOLN

[Series 2: T1 · sagittal · 5.0mm · 0.45mm/px · 3 of 27 slices shown (1 of 4)]
[im 1/27]
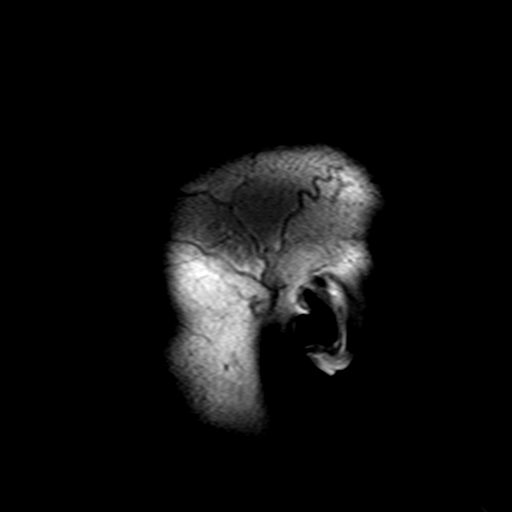
[im 14/27]
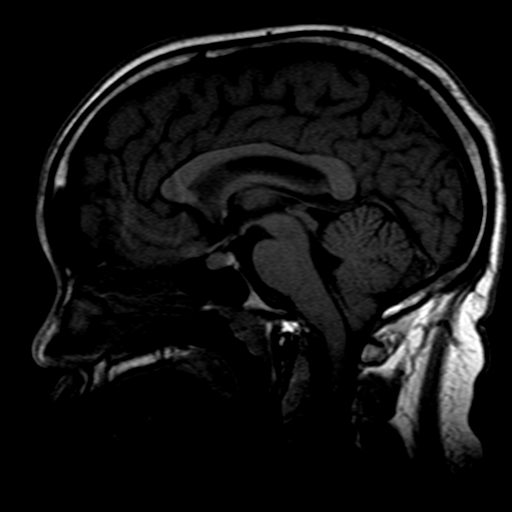
[im 27/27]
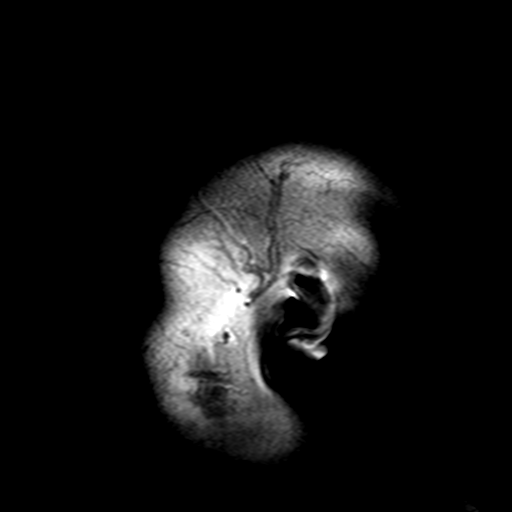

[Series 4: DWI · axial · 4.0mm · 0.94mm/px · z∈[-93,+83]mm · 5 of 45 slices shown]
[im 1/45]
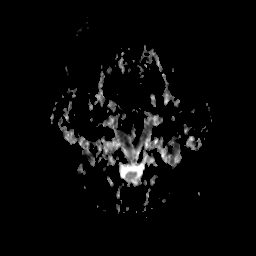
[im 12/45]
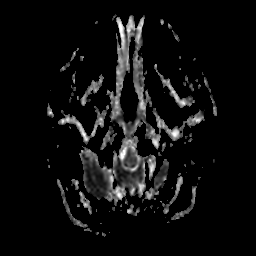
[im 23/45]
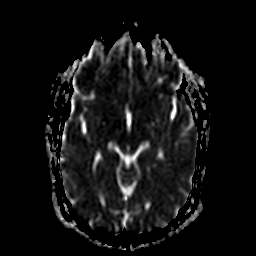
[im 34/45]
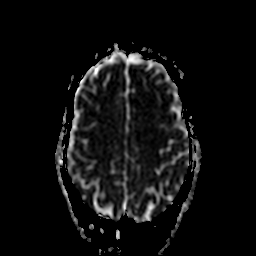
[im 45/45]
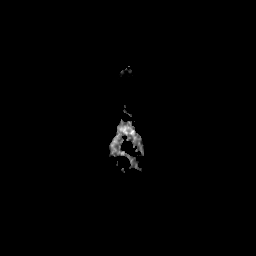

[Series 5: ax (id) · axial · 4.0mm · 0.94mm/px · z∈[-93,+83]mm · 4 of 45 slices shown]
[im 1/45]
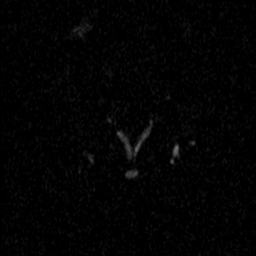
[im 15/45]
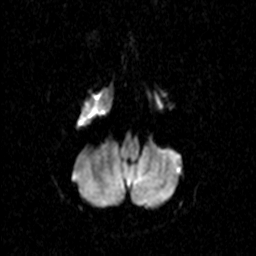
[im 30/45]
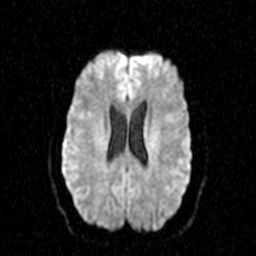
[im 45/45]
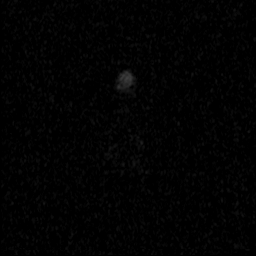

[Series 6: T2 · axial · 5.0mm · 0.45mm/px · z∈[-88,+79]mm · 2 of 25 slices shown (1 of 2)]
[im 1/25]
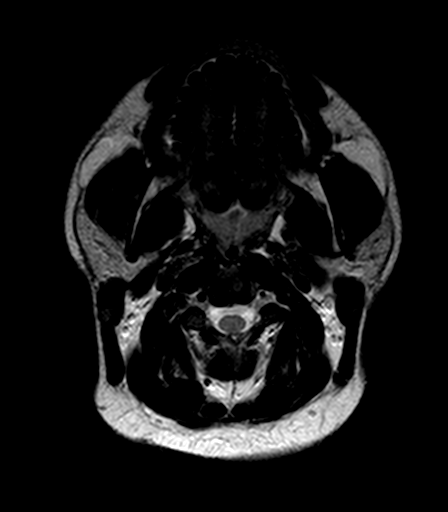
[im 25/25]
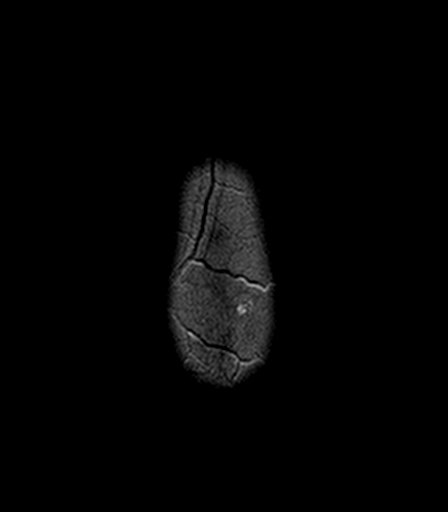

[Series 7: FLAIR · axial · 3.0mm · 0.90mm/px · z∈[-70,+77]mm · 5 of 50 slices shown]
[im 1/50]
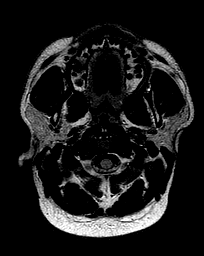
[im 13/50]
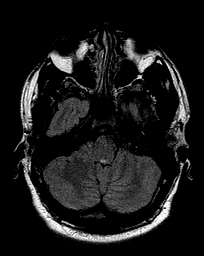
[im 25/50]
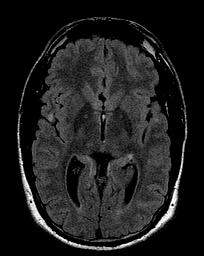
[im 37/50]
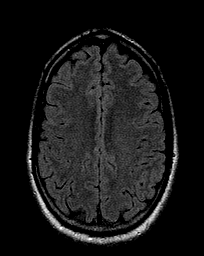
[im 50/50]
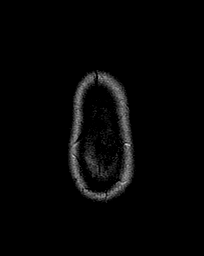

[Series 8: T1 · coronal · 3.0mm · 0.70mm/px · 1 of 15 slices shown (2 of 4)]
[im 1/15]
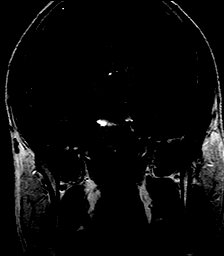

[Series 9: T2 · axial · 5.0mm · 0.45mm/px · z∈[-88,+79]mm · 2 of 25 slices shown (2 of 2)]
[im 1/25]
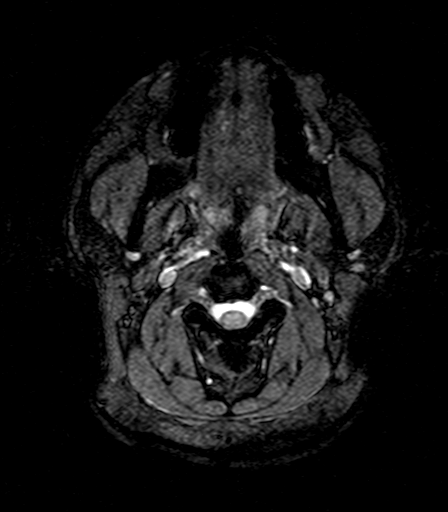
[im 25/25]
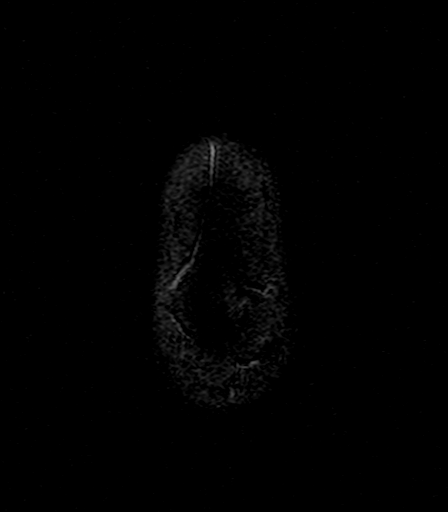

[Series 10: T1 · axial · 3.0mm · 0.70mm/px · 1 of 15 slices shown (3 of 4)]
[im 1/15]
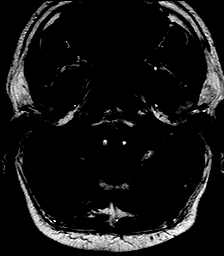

[Series 11: bSSFP · axial · 1.0mm · 0.35mm/px · z∈[-53,-40]mm · 2 of 56 slices shown]
[im 1/56]
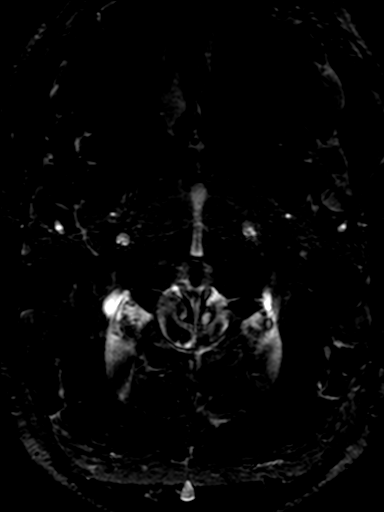
[im 14/56]
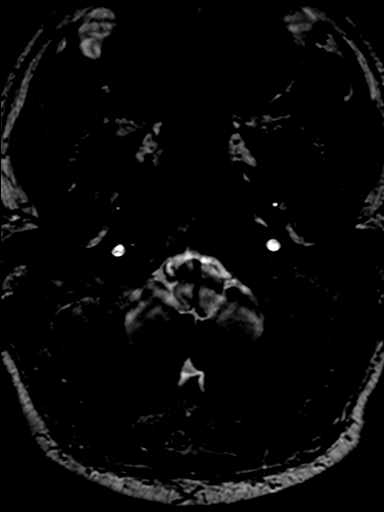

[Series 12: T1 post-contrast · coronal · 3.0mm · 0.70mm/px · 1 of 15 slices shown (1 of 2)]
[im 1/15]
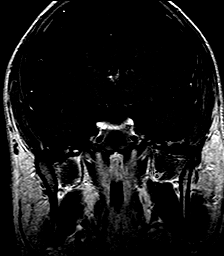

[Series 13: T1 post-contrast · axial · 3.0mm · 0.70mm/px · 1 of 15 slices shown (2 of 2)]
[im 1/15]
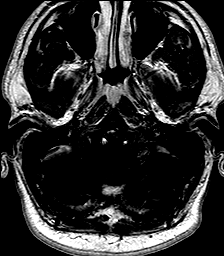

[Series 14: T1 · axial · 1.0mm · 0.50mm/px · z∈[-89,+79]mm · 8 of 192 slices shown (4 of 4)]
[im 12/192]
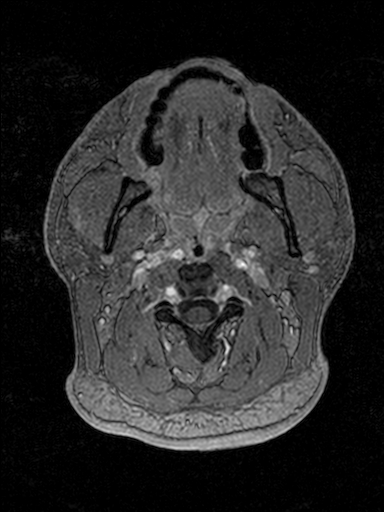
[im 34/192]
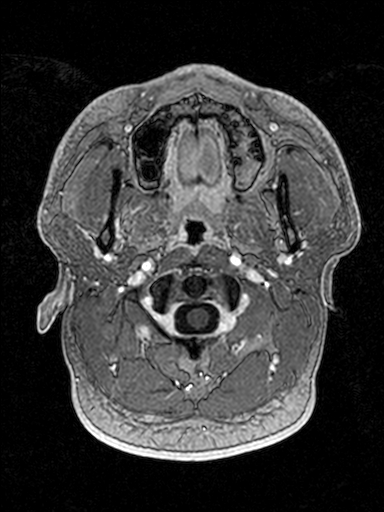
[im 57/192]
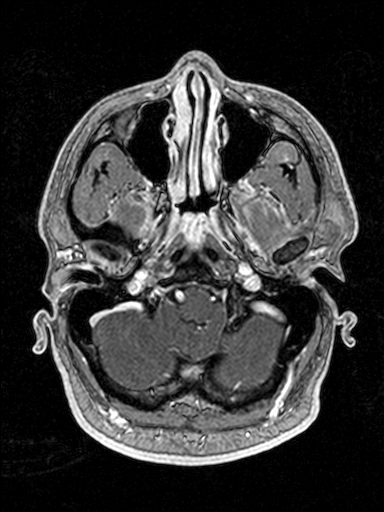
[im 79/192]
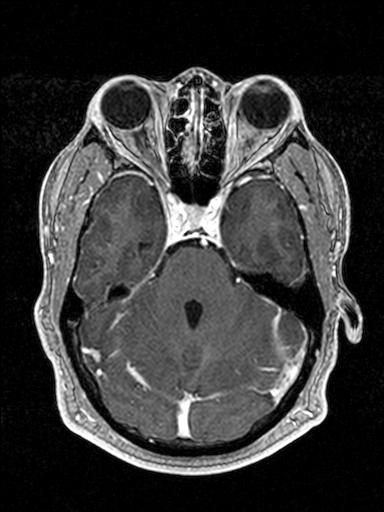
[im 113/192]
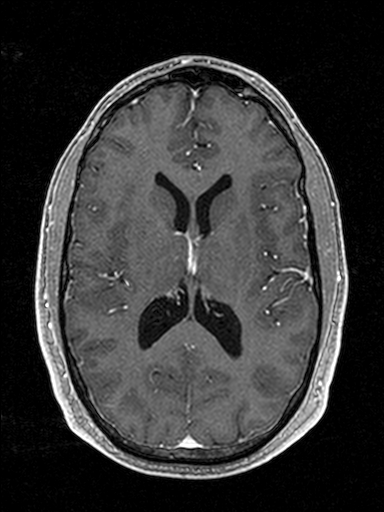
[im 135/192]
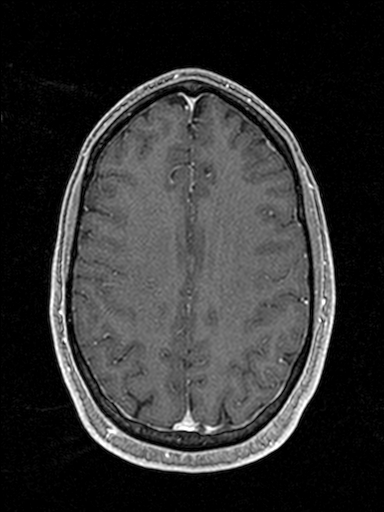
[im 158/192]
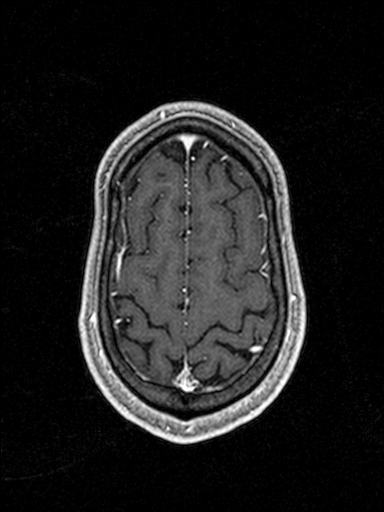
[im 180/192]
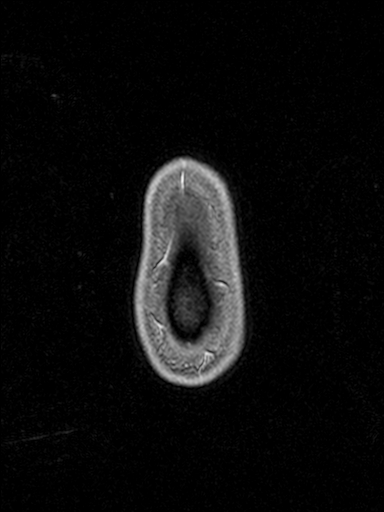

[35 of 48 positions shown; findings below may reference images not displayed]

FINDINGS: Brain: Cerebral volume is stable since 9495 and within normal
limits. Mild scaphocephaly.

No restricted diffusion to suggest acute infarction. No midline
shift, mass effect, evidence of mass lesion, ventriculomegaly,
extra-axial collection or acute intracranial hemorrhage.
Cervicomedullary junction and pituitary are within normal limits.

Gray and white matter signal is within normal limits throughout the
brain. No encephalomalacia or chronic cerebral blood products. No
abnormal enhancement identified. No dural thickening.

Vascular: Major intracranial vascular flow voids are preserved.

Skull and upper cervical spine: Negative visible cervical spine.
Normal bone marrow signal.

Sinuses/Orbits: Bilateral orbits soft tissues appear normal. The
paranasal sinuses are clear. Scalp and face soft tissues appear
within normal limits.

Other: Dedicated internal auditory canal imaging. The bilateral
cerebellopontine angles are within normal limits. Normal bilateral
cisternal and intracanalicular 7th and 8th cranial nerve segments.
Preserved T2 signal in the bilateral cochlea and vestibular
structures. The bilateral mastoid air cells are clear. No abnormal
enhancement identified. The bilateral stylomastoid foramina appear
normal. The parotid glands appear normal.
IMPRESSION: 1. Normal IAC imaging.
2.  Normal MRI appearance of the brain.

## 2018-08-13 ENCOUNTER — Encounter: Payer: Self-pay | Admitting: Family Medicine

## 2018-08-13 ENCOUNTER — Ambulatory Visit: Payer: BLUE CROSS/BLUE SHIELD | Admitting: Family Medicine

## 2018-08-13 VITALS — BP 112/82 | HR 60 | Ht 73.0 in | Wt 233.0 lb

## 2018-08-13 DIAGNOSIS — J0100 Acute maxillary sinusitis, unspecified: Secondary | ICD-10-CM

## 2018-08-13 MED ORDER — AMOXICILLIN-POT CLAVULANATE 875-125 MG PO TABS: 1.0000 | ORAL_TABLET | Freq: Two times a day (BID) | ORAL | 0 refills | Status: AC

## 2018-08-13 NOTE — Progress Notes (Signed)
Date:  08/13/2018   Name:  Jeremy Mcneil   DOB:  [birth year]   MRN:  [id number]   Chief Complaint: Sinusitis (yellow production with cough that seems to stay with him, can't taste anything, sore throat, ear hurts) Sinusitis  This is a chronic problem. The current episode started more than 1 month ago. The problem has been waxing and waning since onset. There has been no fever. His pain is at a severity of 5/10. The pain is mild. Associated symptoms include congestion, coughing, diaphoresis, ear pain, sinus pressure, sneezing and a sore throat. Pertinent negatives include no chills, headaches, neck pain, shortness of breath or swollen glands. Past treatments include nothing.     Review of Systems  Constitutional: Positive for diaphoresis. Negative for chills and fever.  HENT: Positive for congestion, ear pain, sinus pressure, sneezing and sore throat. Negative for drooling and ear discharge.   Respiratory: Positive for cough. Negative for shortness of breath and wheezing.   Cardiovascular: Negative for chest pain, palpitations and leg swelling.  Gastrointestinal: Negative for abdominal pain, blood in stool, constipation, diarrhea and nausea.  Endocrine: Negative for polydipsia.  Genitourinary: Negative for dysuria, frequency, hematuria and urgency.  Musculoskeletal: Negative for back pain, myalgias and neck pain.  Skin: Negative for rash.  Allergic/Immunologic: Negative for environmental allergies.  Neurological: Negative for dizziness and headaches.  Hematological: Does not bruise/bleed easily.  Psychiatric/Behavioral: Negative for suicidal ideas. The patient is not nervous/anxious.     There are no active problems to display for this patient.   No Known Allergies  No past surgical history on file.  Social History   Tobacco Use  . Smoking status: Never Smoker  . Smokeless tobacco: Never Used  Substance Use Topics  . Alcohol use: Yes    Comment: social  . Drug use: No      Medication list has been reviewed and updated.  Current Meds  Medication Sig  . Ascorbic Acid (VITAMIN C PO) Take 2 tablets by mouth daily.    PHQ 2/9 Scores 01/22/2018  PHQ - 2 Score 0  PHQ- 9 Score 0    Physical Exam  Constitutional: He is oriented to person, place, and time. He appears well-developed and well-nourished.  HENT:  Head: Normocephalic.  Right Ear: External ear normal.  Left Ear: External ear normal.  Nose: Nose normal.  Mouth/Throat: Oropharynx is clear and moist.  Eyes: Pupils are equal, round, and reactive to light. Conjunctivae and EOM are normal. Right eye exhibits no discharge. Left eye exhibits no discharge. No scleral icterus.  Neck: Normal range of motion. Neck supple. No JVD present. No tracheal deviation present. No thyromegaly present.  Cardiovascular: Normal rate, regular rhythm, normal heart sounds and intact distal pulses. Exam reveals no gallop and no friction rub.  No murmur heard. Pulmonary/Chest: Breath sounds normal. No respiratory distress. He has no wheezes. He has no rales.  Abdominal: Soft. Bowel sounds are normal. He exhibits no mass. There is no hepatosplenomegaly. There is no tenderness. There is no rebound, no guarding and no CVA tenderness.  Musculoskeletal: Normal range of motion. He exhibits no edema or tenderness.  Lymphadenopathy:    He has no cervical adenopathy.  Neurological: He is alert and oriented to person, place, and time. He has normal strength and normal reflexes. No cranial nerve deficit.  Skin: Skin is warm. No rash noted.  Nursing note and vitals reviewed.   BP 112/82   Pulse 60   Ht 6\' 1"  (1.854 m)  Wt 233 lb (105.7 kg)   BMI 30.74 kg/m   Assessment and Plan:  1. Acute maxillary sinusitis, recurrence not specified Prescribed Augmentin/ next step ENT if continues - amoxicillin-clavulanate (AUGMENTIN) 875-125 MG tablet; Take 1 tablet by mouth 2 (two) times daily.  Dispense: 20 tablet; Refill: 0   Dr.  [facility]  08/13/2018

## 2020-02-02 DIAGNOSIS — Z23 Encounter for immunization: Secondary | ICD-10-CM | POA: Diagnosis not present

## 2020-06-01 DIAGNOSIS — K529 Noninfective gastroenteritis and colitis, unspecified: Secondary | ICD-10-CM | POA: Diagnosis not present
